# Patient Record
Sex: Male | Born: 1940 | Race: White | Hispanic: No | Marital: Married | Smoking: Never smoker
Health system: Southern US, Academic
[De-identification: ages and names within clinical notes are randomized; demographics above are authoritative.]

## PROBLEM LIST (undated history)

## (undated) DIAGNOSIS — I10 Essential (primary) hypertension: Secondary | ICD-10-CM

## (undated) DIAGNOSIS — N400 Enlarged prostate without lower urinary tract symptoms: Secondary | ICD-10-CM

## (undated) DIAGNOSIS — I82409 Acute embolism and thrombosis of unspecified deep veins of unspecified lower extremity: Secondary | ICD-10-CM

## (undated) DIAGNOSIS — K219 Gastro-esophageal reflux disease without esophagitis: Secondary | ICD-10-CM

## (undated) DIAGNOSIS — G459 Transient cerebral ischemic attack, unspecified: Secondary | ICD-10-CM

## (undated) DIAGNOSIS — E785 Hyperlipidemia, unspecified: Secondary | ICD-10-CM

## (undated) DIAGNOSIS — M1990 Unspecified osteoarthritis, unspecified site: Secondary | ICD-10-CM

## (undated) HISTORY — PX: HX KNEE REPLACMENT: SHX125

## (undated) HISTORY — PX: HX HERNIA REPAIR: SHX51

## (undated) HISTORY — PX: EYE SURGERY: SHX253

## (undated) HISTORY — PX: KIDNEY STONE SURGERY: SHX686

## (undated) HISTORY — PX: HX TONSILLECTOMY: SHX27

## (undated) HISTORY — PX: HX BACK SURGERY: SHX140

---

## 1985-04-16 ENCOUNTER — Other Ambulatory Visit (HOSPITAL_COMMUNITY): Payer: Self-pay

## 2020-11-02 IMAGING — MR MRI CERVICAL SPINE WITHOUT CONTRAST
4 of 5 series · 31 of 48 positions shown · IV contrast (gadolinium)
Comparison: None available.

﻿EXAM:  MRI CERVICAL SPINE WITHOUT CONTRAST
INDICATION: Bilateral lower extremity weakness and difficulty walking.
TECHNIQUE: Multiplanar multisequential MRI of the cervical spine was performed without gadolinium contrast.

[Series 5: T2 · sagittal · 3.0mm · 0.78mm/px · 7 of 11 slices shown (1 of 2)]
[im 1/11]
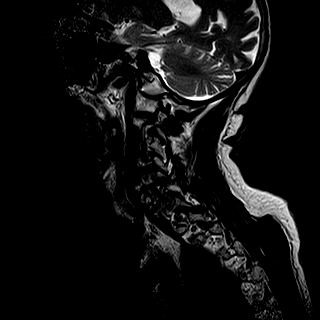
[im 2/11]
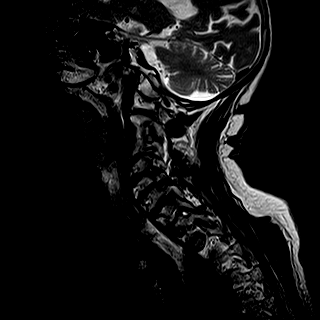
[im 4/11]
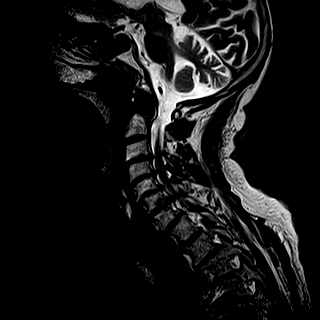
[im 6/11]
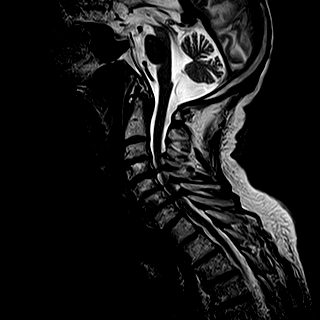
[im 7/11]
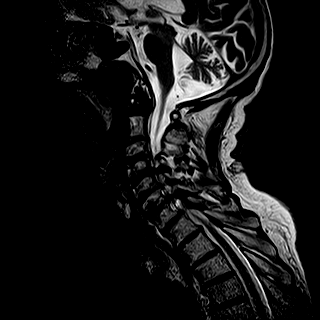
[im 9/11]
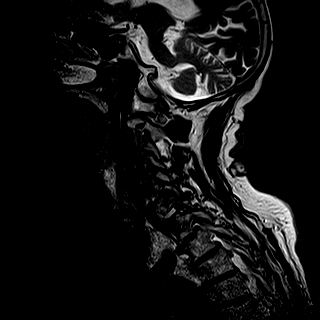
[im 11/11]
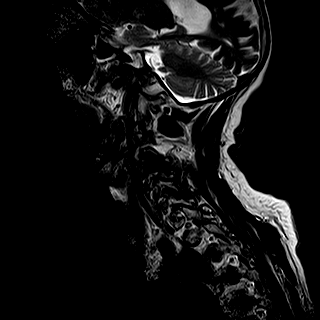

[Series 6: T1 · sagittal · 3.0mm · 0.78mm/px · 7 of 11 slices shown]
[im 1/11]
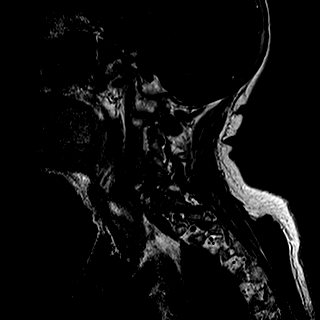
[im 2/11]
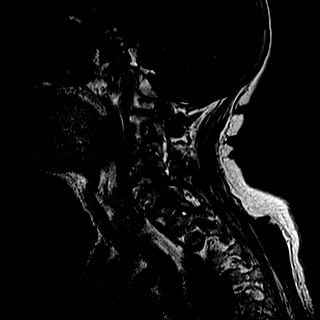
[im 4/11]
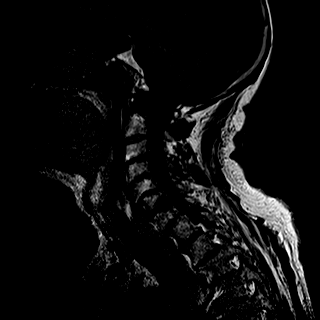
[im 6/11]
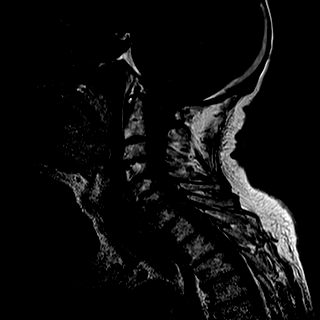
[im 7/11]
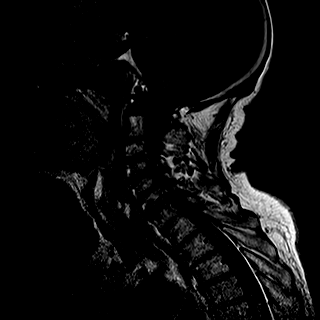
[im 9/11]
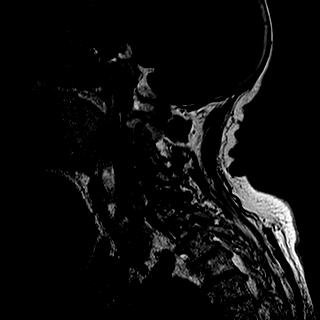
[im 11/11]
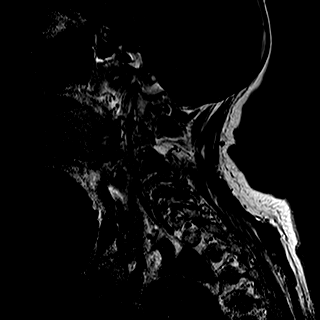

[Series 7: STIR · sagittal · 3.0mm · 0.87mm/px · 8 of 11 slices shown]
[im 1/11]
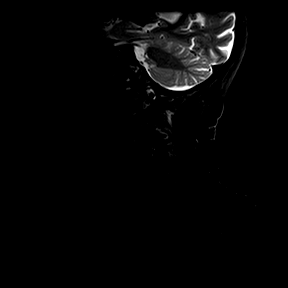
[im 2/11]
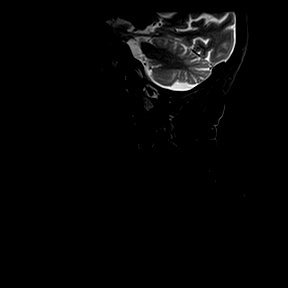
[im 3/11]
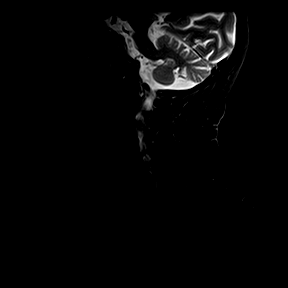
[im 5/11]
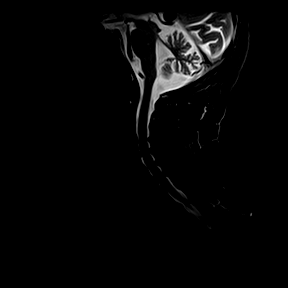
[im 6/11]
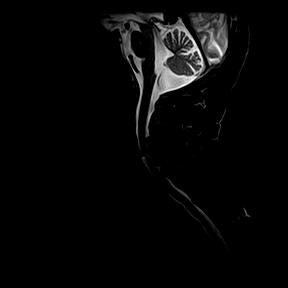
[im 8/11]
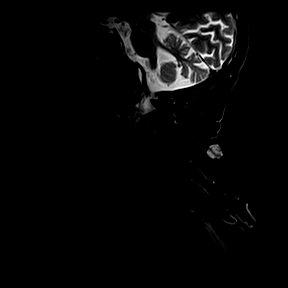
[im 9/11]
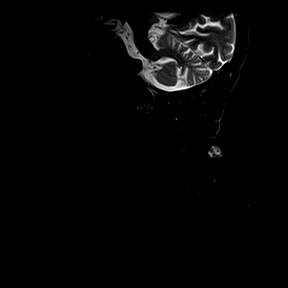
[im 11/11]
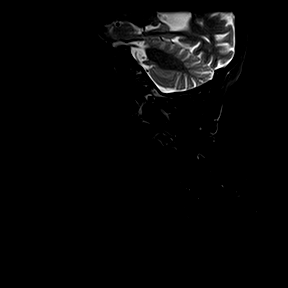

[Series 10: T2 · axial · 4.0mm · 0.31mm/px · z∈[-92,+8]mm · 9 of 18 slices shown (2 of 2)]
[im 1/18]
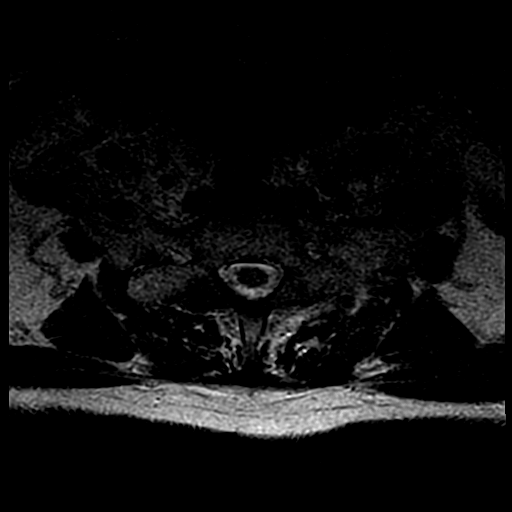
[im 3/18]
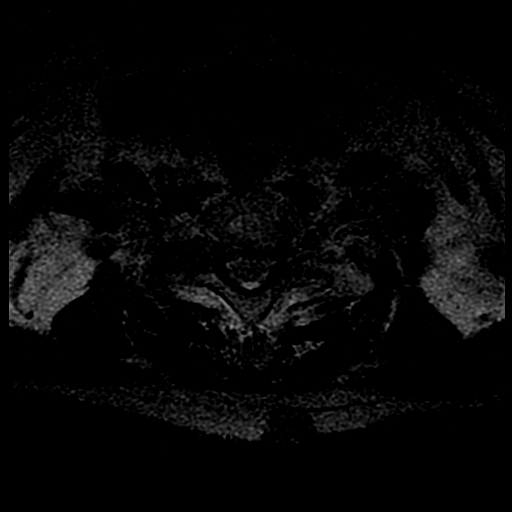
[im 6/18]
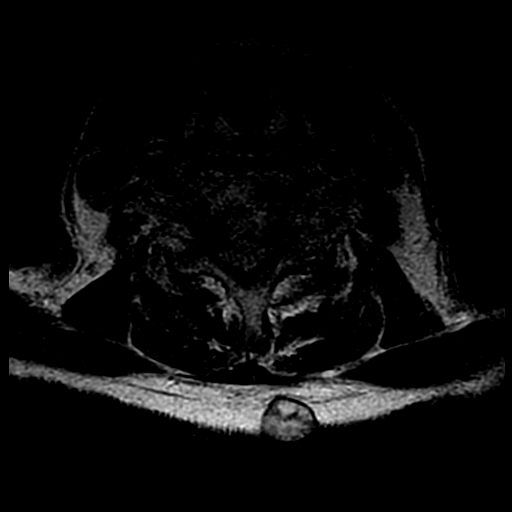
[im 8/18]
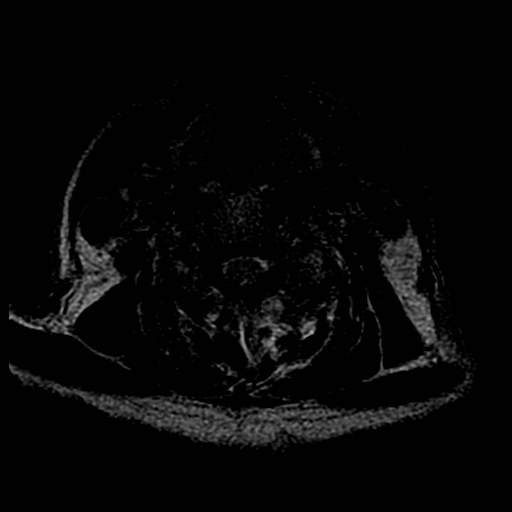
[im 9/18]
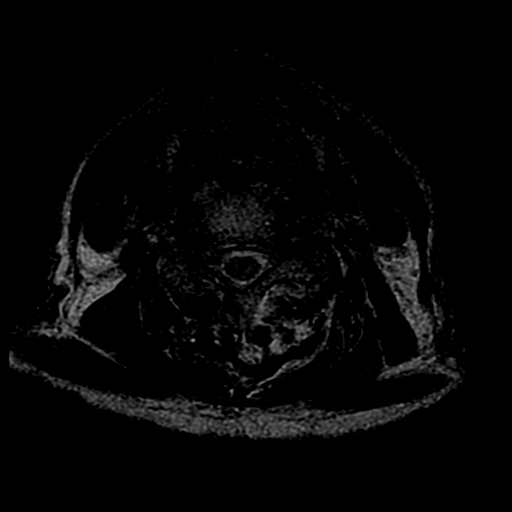
[im 10/18]
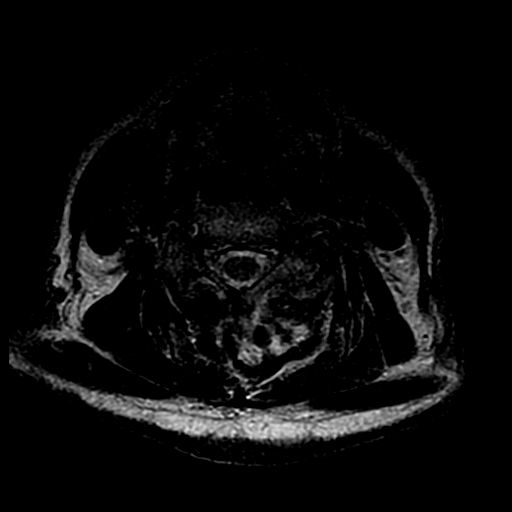
[im 12/18]
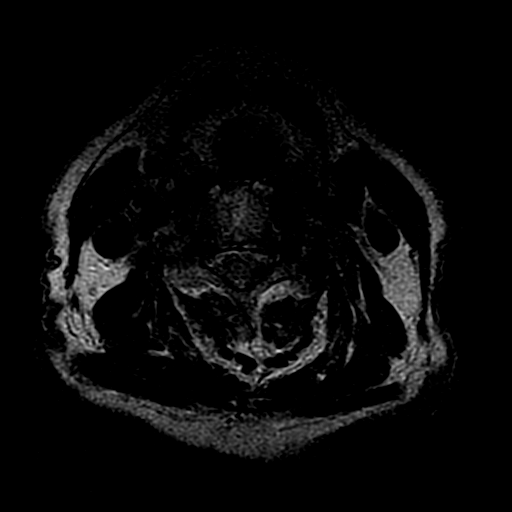
[im 15/18]
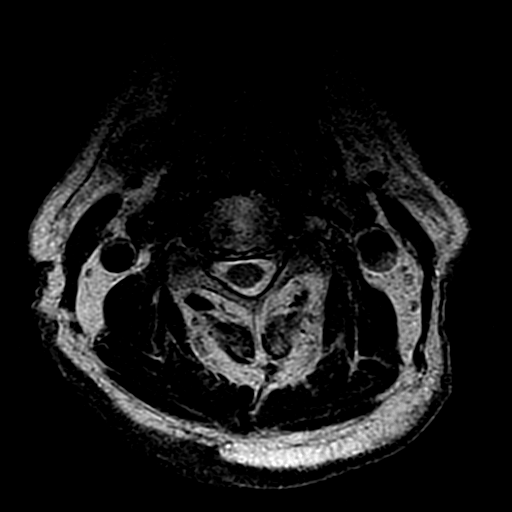
[im 18/18]
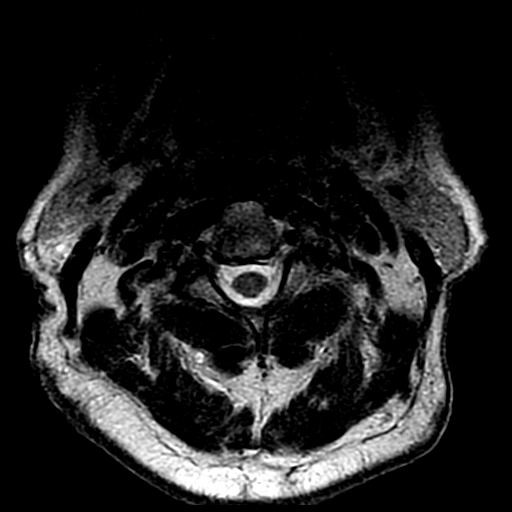

[31 of 48 positions shown; findings below may reference images not displayed]

FINDINGS: Vertebral bodies are normal in height, alignment and signal intensity. There is no acute fracture or subluxation. Moderate to marked disc desiccation is seen at most levels. Visualized spinal cord is normal in signal intensity.

At C2-3 level, there is mild right neural foraminal stenosis from facet and uncovertebral joint hypertrophy.

At C3-4 level, there is a small broad-based central disc osteophyte complex resulting in mild spinal stenosis. There is severe bilateral neural foraminal stenosis from facet and uncovertebral joint hypertrophy.

At C4-5 level, there is a small broad-based central disc osteophyte complex resulting in moderate spinal stenosis. There is severe bilateral neural foraminal stenosis from facet and uncovertebral joint hypertrophy.

At C5-6 level, there is a small broad-based central disc osteophyte complex resulting in mild spinal stenosis. There is severe bilateral neural foraminal stenosis from facet and uncovertebral joint hypertrophy.

At C6-7 level, there is a small broad-based central disc osteophyte complex resulting in moderate spinal stenosis. There is severe bilateral neural foraminal stenosis from facet and uncovertebral joint hypertrophy.

At C7-T1 level, there is a small broad-based central disc osteophyte complex with near complete effacement of the ventral CSF. There is mild left and mild-to-moderate right neural foraminal stenosis from facet and uncovertebral joint hypertrophy.

Paraspinal soft tissues are unremarkable. There is suggestion of a 15 mm probable sebaceous cyst within the subcutaneous tissues of the posterior neck.
IMPRESSION: 1. Advanced degenerative changes with moderate spinal stenosis at C4-5 and C6-7 levels from small central disc osteophyte complexes. No spinal cord signal abnormality is seen to suggest cord edema or myelomalacia. Close clinical follow-up and neurosurgical consult are recommended. 

2. Multilevel neural foraminal stenosis as detailed above.

## 2020-11-02 IMAGING — MR MRI LUMBAR SPINE WITHOUT CONTRAST
6 of 7 series · 40 of 48 positions shown · IV contrast (gadolinium)
Comparison: None available.

﻿EXAM:  MRI LUMBAR SPINE WITHOUT CONTRAST
INDICATION: Bilateral lower extremity weakness and difficulty walking.
TECHNIQUE: Multiplanar multisequential MRI of the lumbar spine was performed without gadolinium contrast.

[Series 5: T2 · sagittal · 4.0mm · 0.94mm/px · 4 of 13 slices shown (1 of 4)]
[im 1/13]
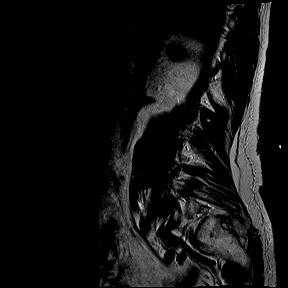
[im 5/13]
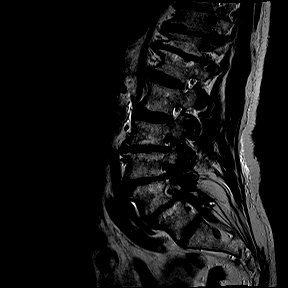
[im 9/13]
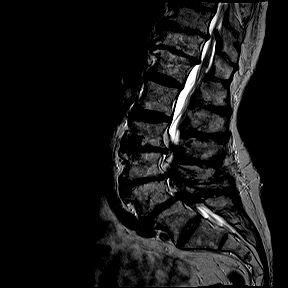
[im 13/13]
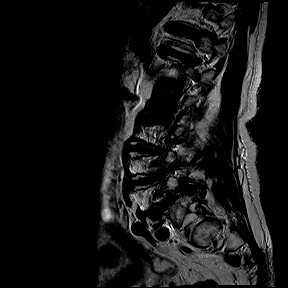

[Series 6: T1 · sagittal · 4.0mm · 0.94mm/px · 5 of 13 slices shown (1 of 2)]
[im 1/13]
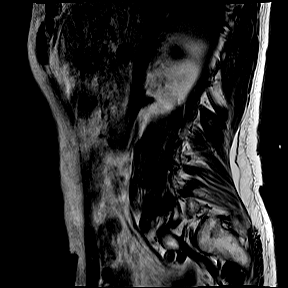
[im 4/13]
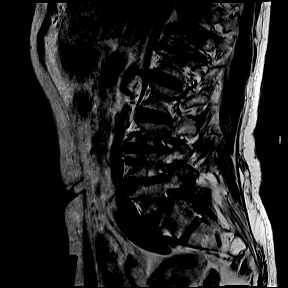
[im 7/13]
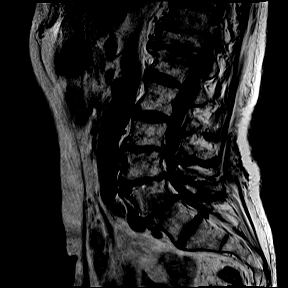
[im 10/13]
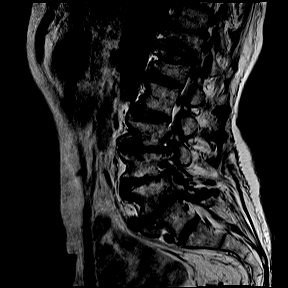
[im 13/13]
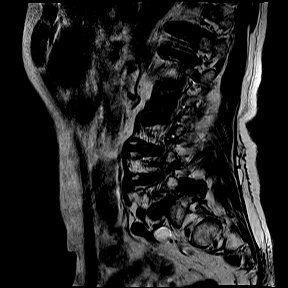

[Series 10: T2 · oblique · 4.0mm · 0.47mm/px · 9 of 23 slices shown (2 of 4)]
[im 1/23]
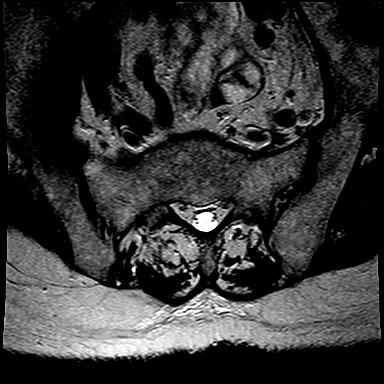
[im 3/23]
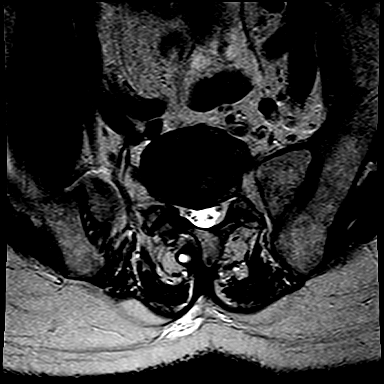
[im 6/23]
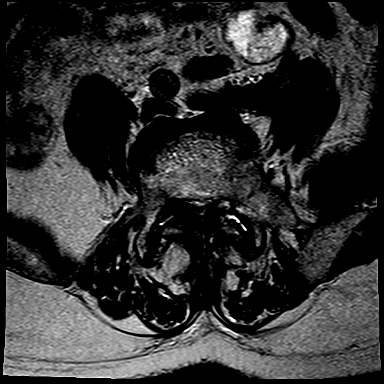
[im 9/23]
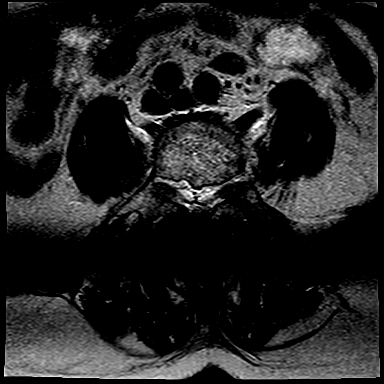
[im 12/23]
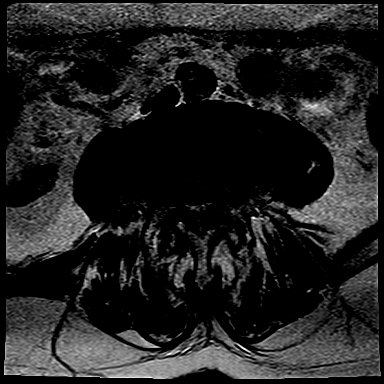
[im 14/23]
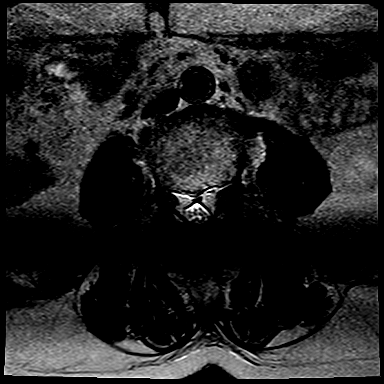
[im 17/23]
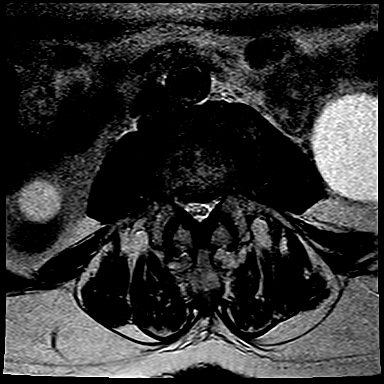
[im 20/23]
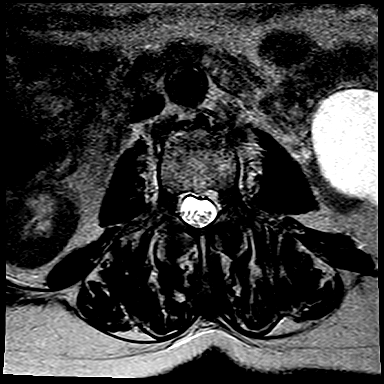
[im 23/23]
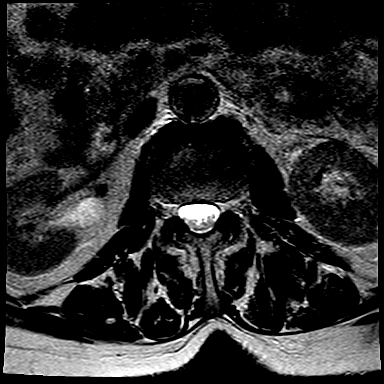

[Series 11: T1 · oblique · 4.0mm · 0.47mm/px · 6 of 23 slices shown (2 of 2)]
[im 1/23]
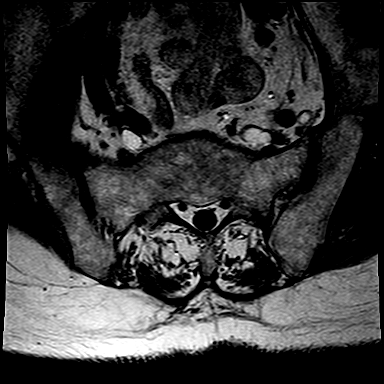
[im 3/23]
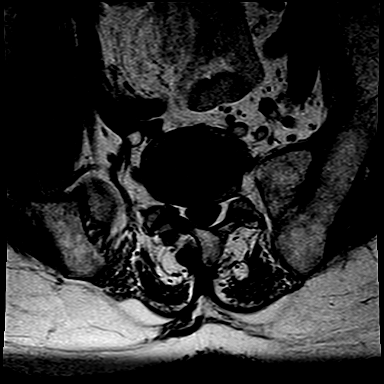
[im 6/23]
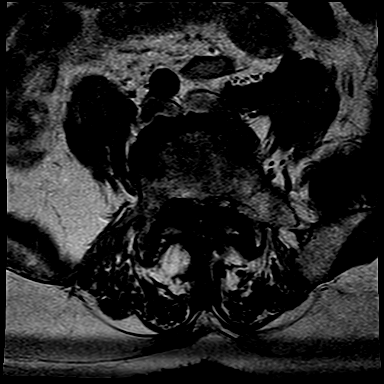
[im 9/23]
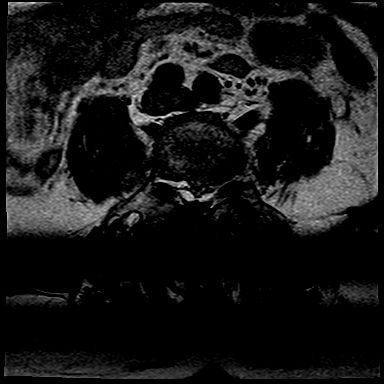
[im 14/23]
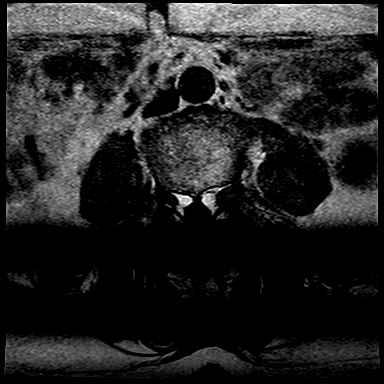
[im 17/23]
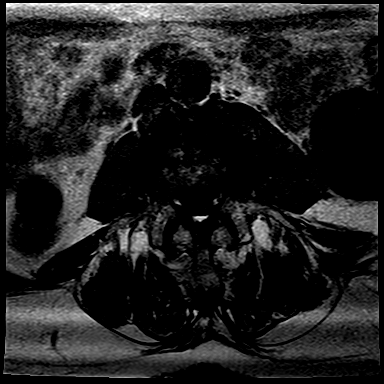

[Series 12: T2 · axial · 4.0mm · 0.52mm/px · z∈[+49,+141]mm · 8 of 20 slices shown (3 of 4)]
[im 1/20]
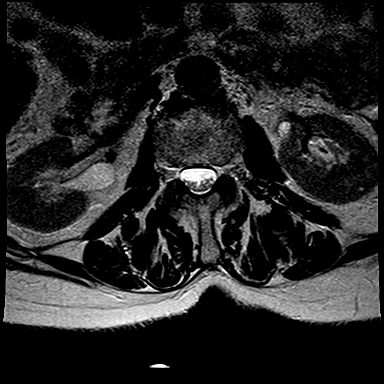
[im 3/20]
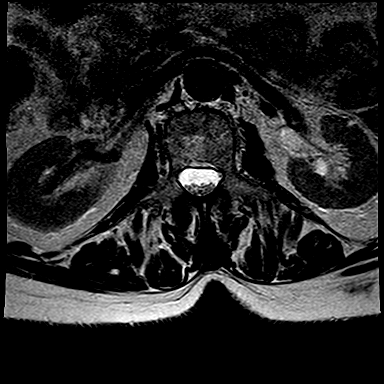
[im 6/20]
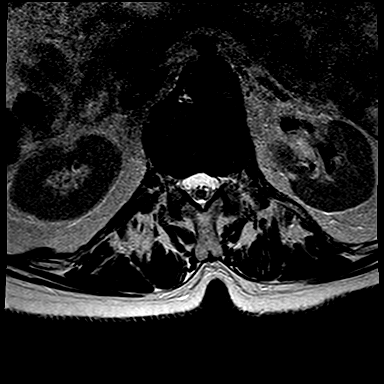
[im 9/20]
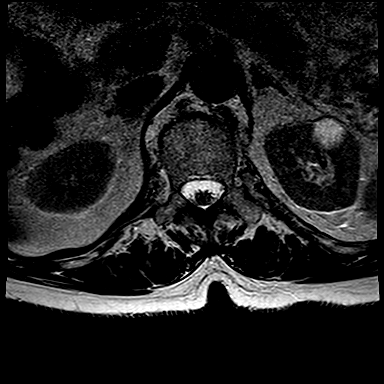
[im 11/20]
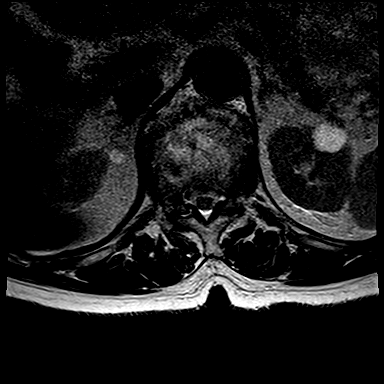
[im 14/20]
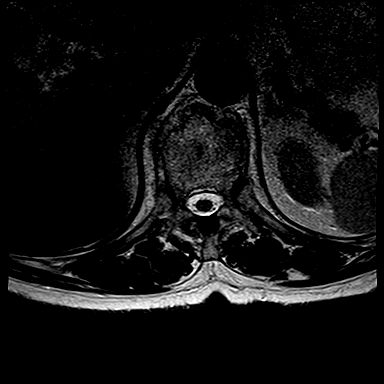
[im 17/20]
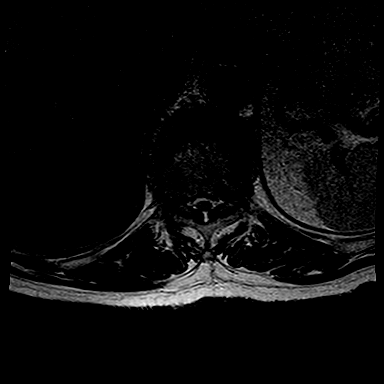
[im 20/20]
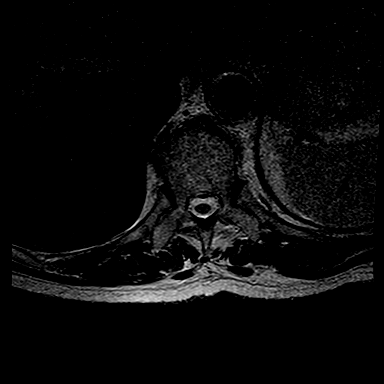

[Series 13: T2 · coronal · 4.0mm · 1.22mm/px · 8 of 20 slices shown (4 of 4)]
[im 1/20]
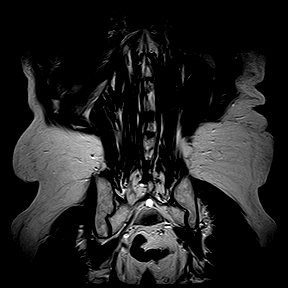
[im 3/20]
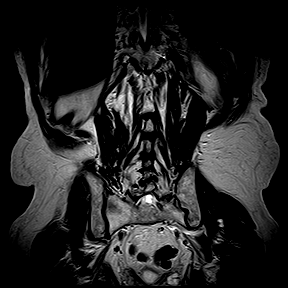
[im 6/20]
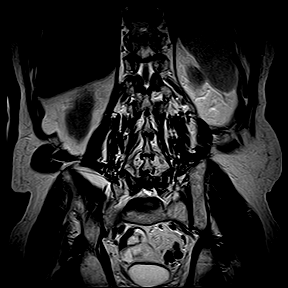
[im 9/20]
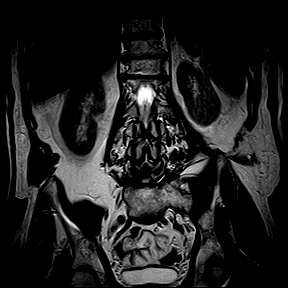
[im 11/20]
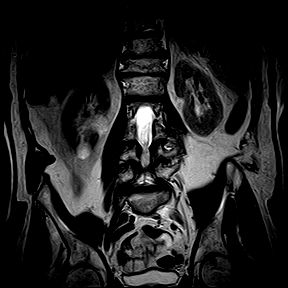
[im 14/20]
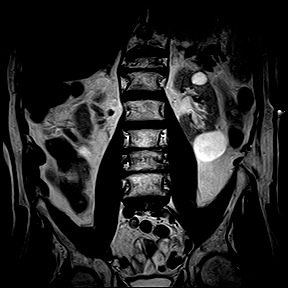
[im 17/20]
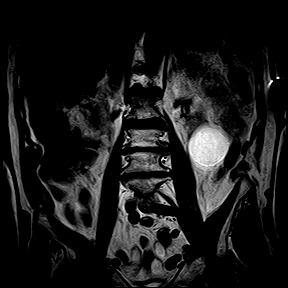
[im 20/20]
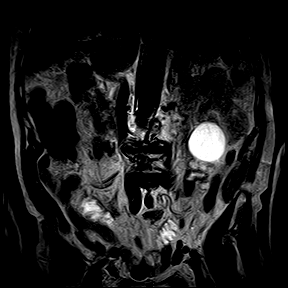

[40 of 48 positions shown; findings below may reference images not displayed]

FINDINGS: Bone marrow signal intensity is normal. There is no acute fracture or subluxation. Distal spinal cord is normal in signal intensity and terminates normally at T12-L1 disc space level. Spinal canal is congenitally narrow.

L1-2 level is unremarkable.

At L2-3 level, there is moderate to marked disc desiccation. There is a small broad-based central disc resulting in moderate to severe spinal stenosis. There is moderate to severe bilateral neural foraminal stenosis from facet arthropathy and bulging annulus.

At L3-4 level, there is moderate to marked disc desiccation. There is a small broad-based central disc bulge resulting in severe spinal stenosis. There is severe left and moderate right neural foraminal stenosis from facet arthropathy and bulging annulus.

At L4-5 level, there is marked disc desiccation. There is also grade 1 anterolisthesis of L4 on L5 vertebral body. There is a small broad-based central disc bulge resulting in severe spinal stenosis. There is severe bilateral neural foraminal stenosis from facet arthropathy and bulging annulus.

L5-S1 level and paraspinal soft tissues are unremarkable. There are bilateral renal cysts.
IMPRESSION: 1. Grade 1 anterolisthesis of L4 on L5 vertebral body. 

2. Severe spinal stenosis at L3-4 and L4-5 levels and moderate to severe spinal stenosis at L2-3 level from small central disc bulges. 

3. Multilevel neural foraminal stenosis as detailed above.

## 2021-08-22 IMAGING — US CAROTID US W/DOPPLER
1 series · 14 of 24 positions shown · non-contrast
Comparison: 08/25/2020.

﻿EXAM:  CAROTID US W/DOPPLER
INDICATION: Carotid stenosis. History of left carotid endarterectomy.

[Series 1: carotid us w/doppler · 14 of 87 slices shown]
[im 1/87]
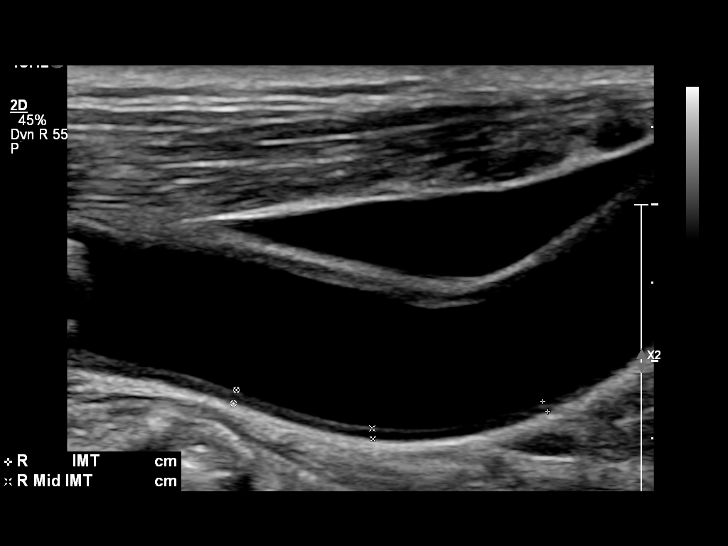
[im 8/87]
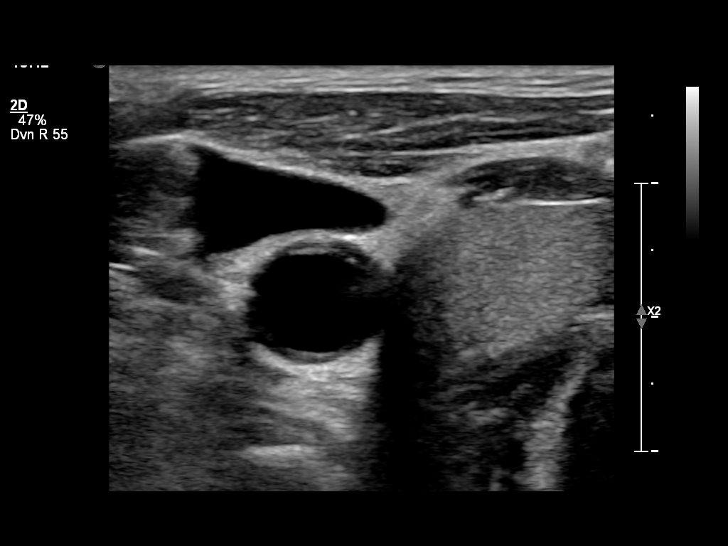
[im 15/87]
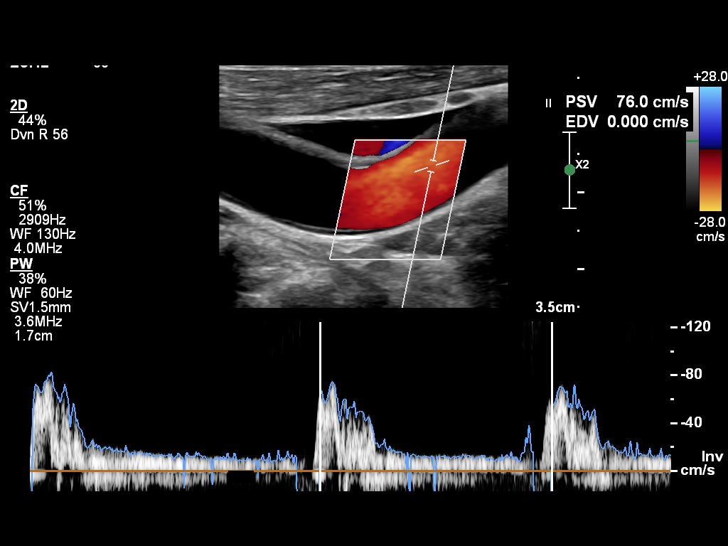
[im 23/87]
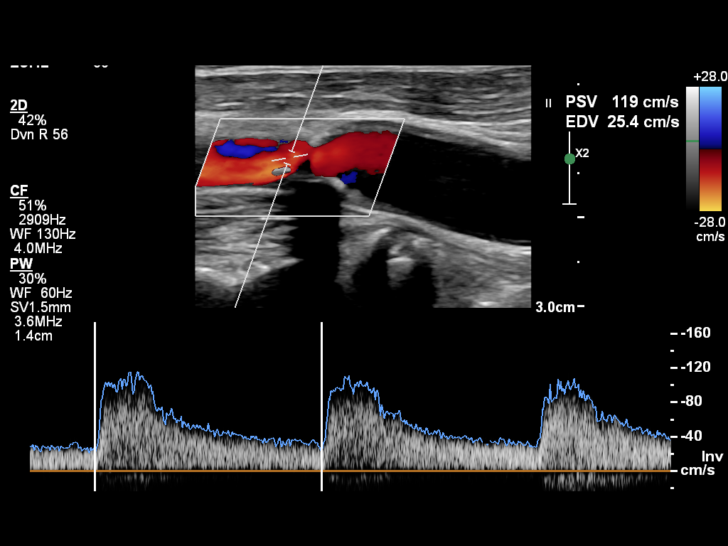
[im 27/87]
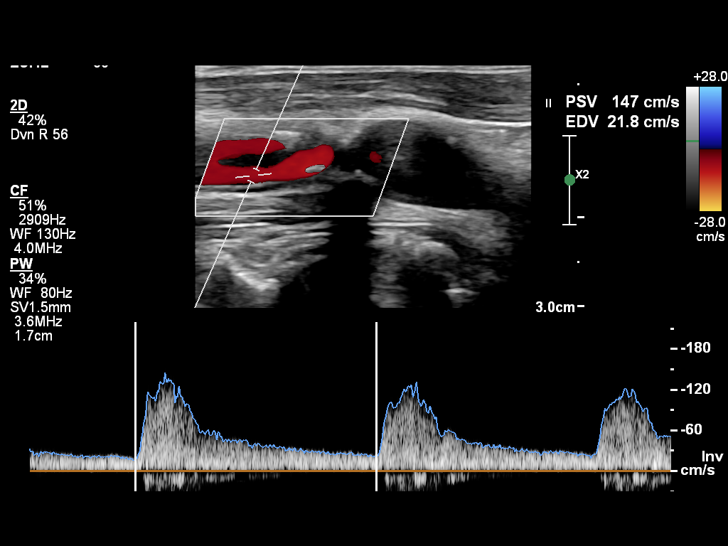
[im 34/87]
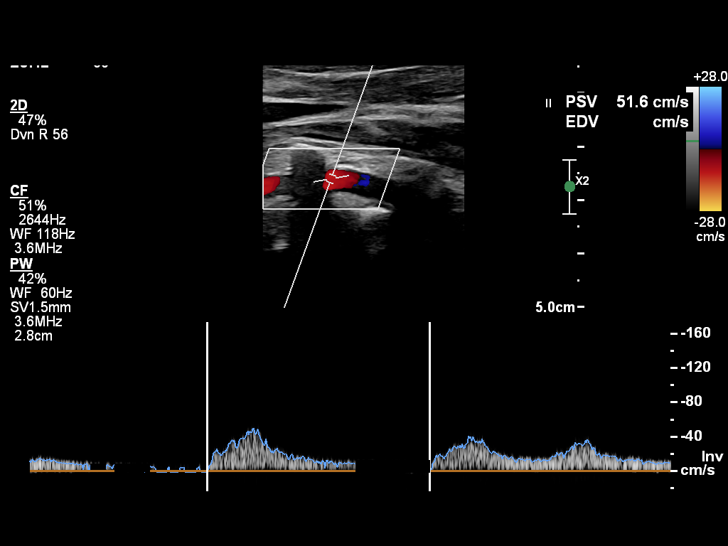
[im 42/87]
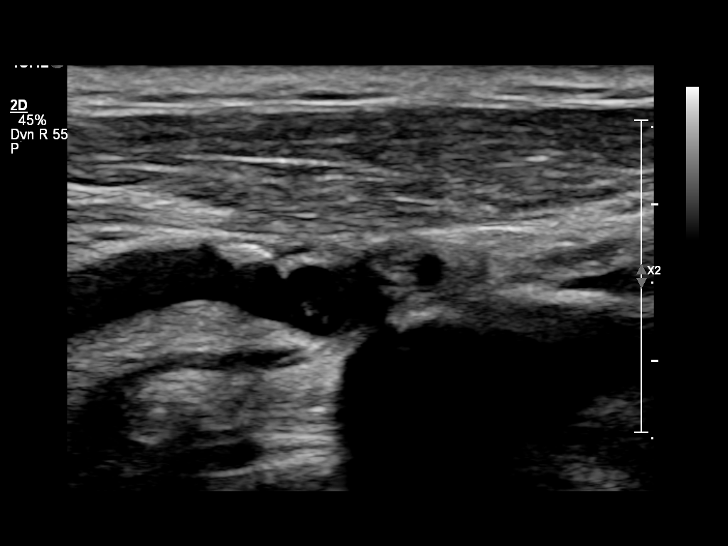
[im 45/87]
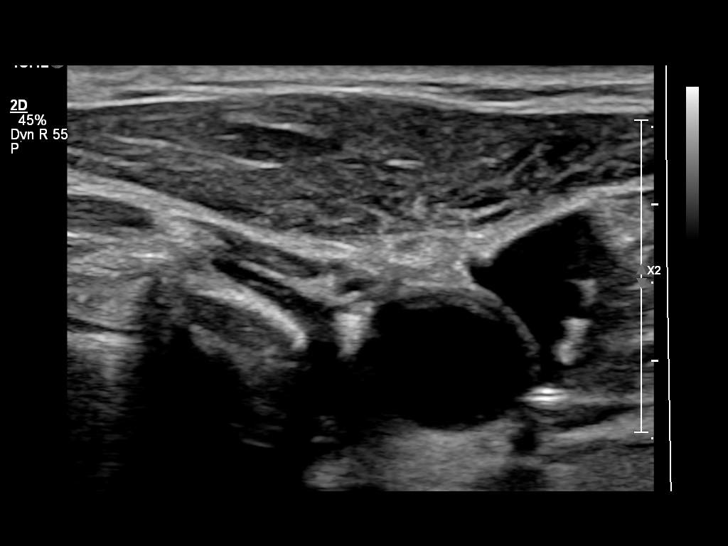
[im 53/87]
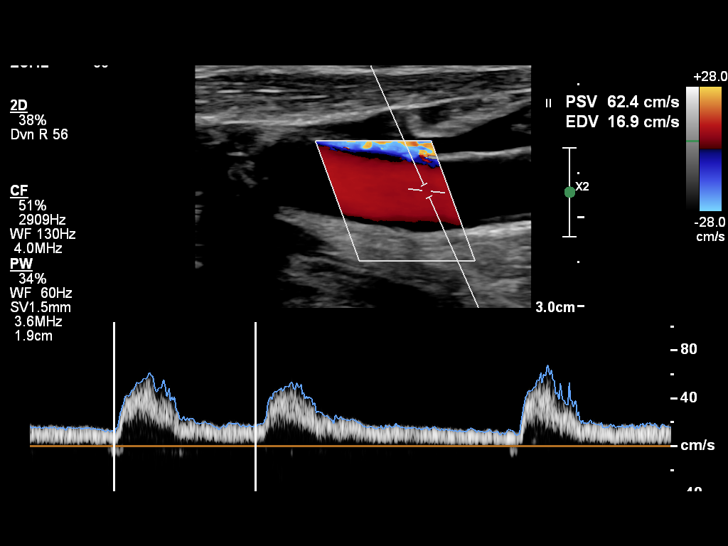
[im 60/87]
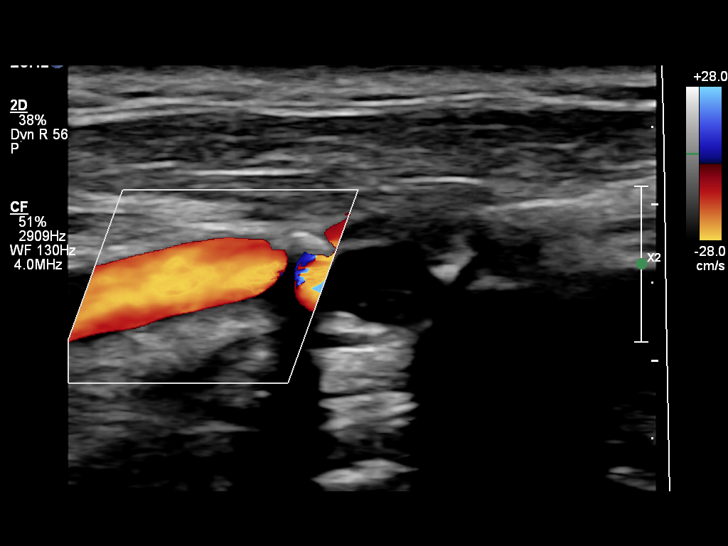
[im 68/87]
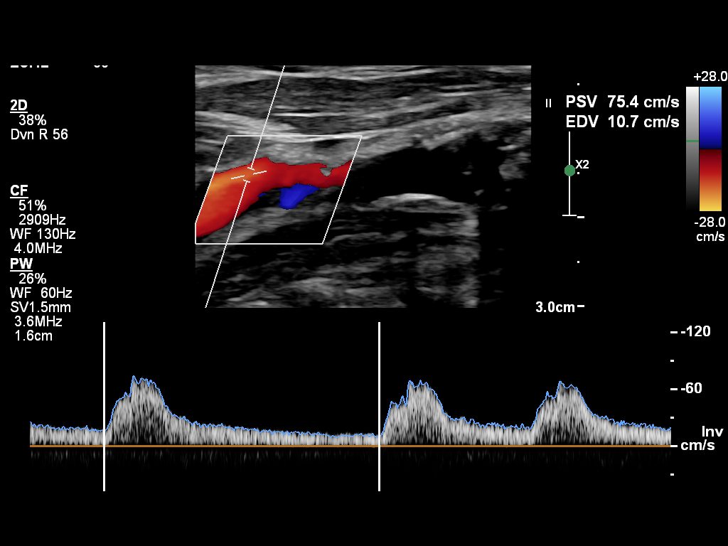
[im 72/87]
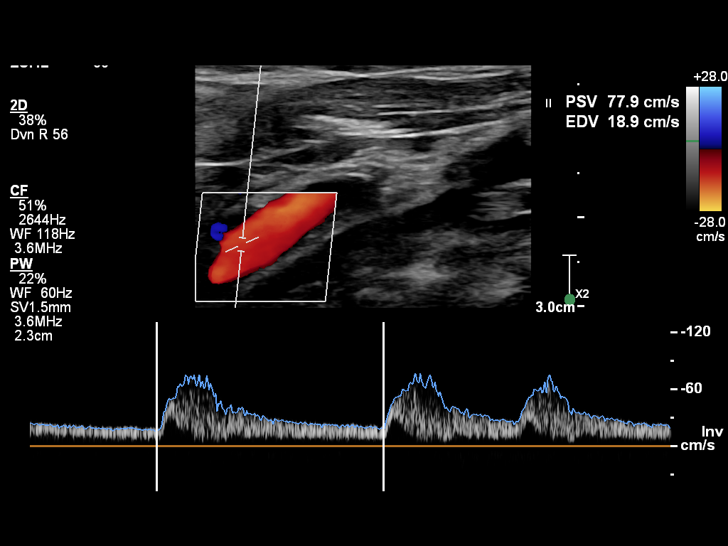
[im 79/87]
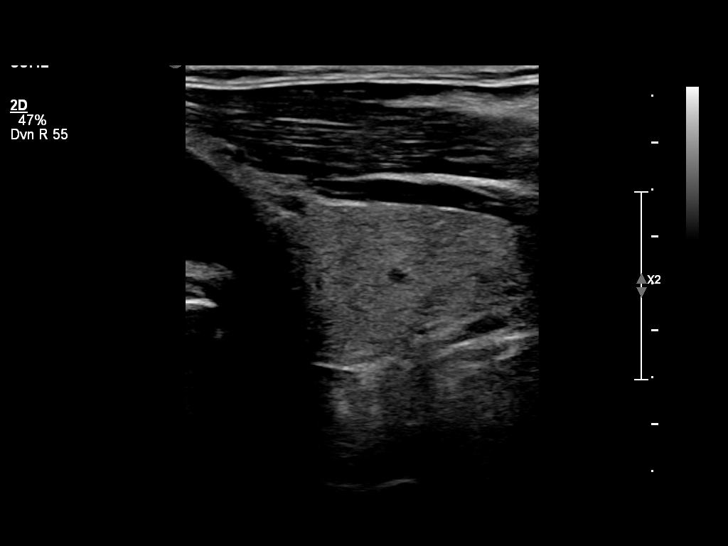
[im 87/87]
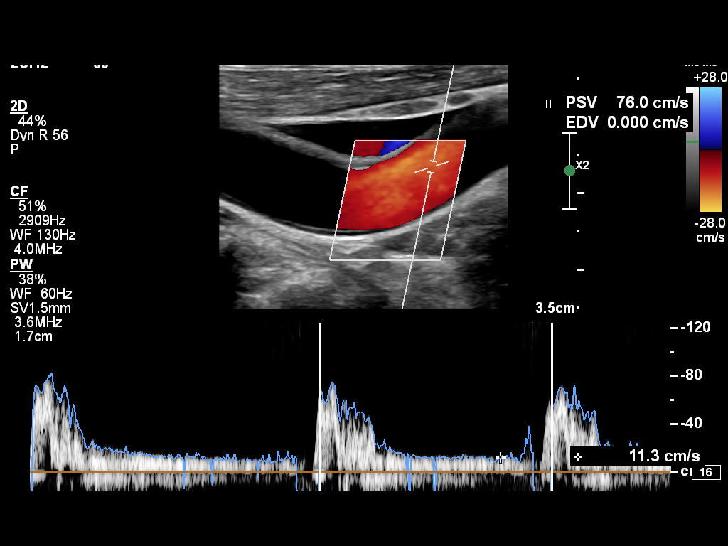

[14 of 24 positions shown; findings below may reference images not displayed]

FINDINGS: Right CCA

76

Left CCA

69

PSV

IC/CC

EDV

Right Internal

147

1.93

22

Left Internal

109

1.52

26

Right Vertebral

Antegrade

Left Vertebral

Antegrade

Moderate amount of calcified atherosclerotic plaque is seen within the carotid bulbs bilaterally. No hemodynamically significant stenosis or abnormal elevation of velocity is seen at any level. Both vertebral arteries are patent with appropriate antegrade direction of flow.
IMPRESSION: No hemodynamically significant stenosis within the internal carotid arteries as per NASCET criteria.

## 2022-03-25 ENCOUNTER — Other Ambulatory Visit (HOSPITAL_COMMUNITY): Payer: Self-pay

## 2022-03-25 DIAGNOSIS — M5416 Radiculopathy, lumbar region: Secondary | ICD-10-CM

## 2022-04-01 ENCOUNTER — Other Ambulatory Visit (HOSPITAL_COMMUNITY): Payer: Medicare Other

## 2022-04-03 ENCOUNTER — Inpatient Hospital Stay
Admission: RE | Admit: 2022-04-03 | Discharge: 2022-04-03 | Disposition: A | Discharge status: Home / Self Care | Payer: Medicare Other | Source: Ambulatory Visit

## 2022-04-03 ENCOUNTER — Other Ambulatory Visit: Payer: Self-pay

## 2022-04-03 ENCOUNTER — Encounter (HOSPITAL_COMMUNITY): Payer: Self-pay

## 2022-04-03 DIAGNOSIS — M5416 Radiculopathy, lumbar region: Secondary | ICD-10-CM | POA: Insufficient documentation

## 2022-04-03 HISTORY — DX: Unspecified osteoarthritis, unspecified site: M19.90

## 2022-04-03 HISTORY — DX: Hyperlipidemia, unspecified: E78.5

## 2022-04-03 HISTORY — DX: Benign prostatic hyperplasia without lower urinary tract symptoms: N40.0

## 2022-04-03 HISTORY — DX: Acute embolism and thrombosis of unspecified deep veins of unspecified lower extremity (CMS HCC): I82.409

## 2022-04-03 HISTORY — DX: Gastro-esophageal reflux disease without esophagitis: K21.9

## 2022-04-03 HISTORY — DX: Essential (primary) hypertension: I10

## 2022-04-03 HISTORY — DX: Transient cerebral ischemic attack, unspecified: G45.9

## 2022-04-03 MED ORDER — IOHEXOL 350 MG IODINE/ML INTRAVENOUS SOLUTION
5.0000 mL | INTRAVENOUS | Status: AC
Start: 2022-04-03 — End: 2022-04-03
  Administered 2022-04-03: 5 mL via INTRA_ARTICULAR

## 2022-04-03 MED ORDER — DEXAMETHASONE SODIUM PHOSPHATE (PF) 10 MG/ML INJECTION SOLUTION
INTRAMUSCULAR | Status: AC
Start: 2022-04-03 — End: 2022-04-03
  Filled 2022-04-03: qty 1

## 2022-04-03 MED ORDER — LIDOCAINE (PF) 20 MG/ML (2 %) INJECTION SOLUTION
INTRAMUSCULAR | Status: AC
Start: 2022-04-03 — End: 2022-04-03
  Filled 2022-04-03: qty 15

## 2022-04-03 MED ORDER — DEXAMETHASONE SODIUM PHOSPHATE (PF) 10 MG/ML INJECTION SOLUTION
Freq: Once | INTRAMUSCULAR | Status: AC | PRN
Start: 2022-04-03 — End: 2022-04-03
  Administered 2022-04-03 (×2): 5 mg via EPIDURAL

## 2022-04-03 MED ORDER — LIDOCAINE HCL 20 MG/ML (2 %) INJECTION SOLUTION
Freq: Once | INTRAMUSCULAR | Status: AC | PRN
Start: 2022-04-03 — End: 2022-04-03
  Administered 2022-04-03 (×2): 1 mL via INTRAMUSCULAR

## 2022-04-03 MED ORDER — LIDOCAINE HCL 20 MG/ML (2 %) INJECTION SOLUTION
Freq: Once | INTRAMUSCULAR | Status: AC | PRN
Start: 2022-04-03 — End: 2022-04-03
  Administered 2022-04-03: 5 mL via INTRADERMAL

## 2022-04-03 NOTE — Brief Op Note (Signed)
04/03/22      Radiologist: Dr. Neomia Dear    Procedure: PAIN CL FL EPIDURAL STEROID INJECTION LUMBAR    Description of Procedure Findings:      Lumbar radiculopathy        Estimated Blood Loss:  0 mL        Diagnosis: Lumbar radiculopathy; as above; dictated report to follow      Neomia Dear, MD

## 2022-04-03 NOTE — Discharge Instructions (Signed)
SURGICAL DISCHARGE INSTRUCTIONS     Dr. Dortha Schwalbe*  performed your * Transforaminal Epidural Steroid Injection bilateral L4/5* today at the [facility]    [facility]:  Monday through Friday from 8 a.m. - 4 p.m.: [phone]    For T&D: [id number]  Between 4 p.m. - 8 a.m., weekends and holidays:  Call ER [phone]    PLEASE SEE WRITTEN HANDOUTS AS DISCUSSED BY YOUR NURSE:  ESI    SIGNS AND SYMPTOMS OF A WOUND / INCISION INFECTION   Be sure to watch for the following:  Increase in redness or red streaks near or around the wound or incision.  Increase in pain that is intense or severe and cannot be relieved by the pain medication that your doctor has given you.  Increase in fever for longer than 24 hours, or an increase that is higher than 101 degrees Fahrenheit (normal body temperature is 98 degrees Fahrenheit). **CALL YOUR DOCTOR IF ONE OR MORE OF THESE SIGNS / SYMPTOMS SHOULD OCCUR.    ANESTHESIA INFORMATION   LOCAL ANESTHETIC:  You have receieved a local anesthetic, the effects should disappear in a few hours.    REMEMBER   If you experience any difficulty breathing, chest pain, bleeding that you feel is excessive, persistent nausea or vomiting or for any other concerns:  Call your physician Dr.  Dortha Schwalbe   at [phone] . You may also ask to have the general doctor on call paged. They are available to you 24 hours a day.      SPECIAL INSTRUCTIONS / COMMENTS   Ice to injection site as needed for pain or swelling for next 24 hours. Then you may alternate heat and ice if needed. Tylenol as needed for pain.  Please notify Dr. Tempie Donning that you have developed the numbness in the legs that goes into your rectum that wasn't there at your last visit.    FOLLOW-UP APPOINTMENTS   Please call your surgeon's office at the number listed to schedule a date / time of return for follow-up.   Keep your appointment with Dr. Tempie Donning .  Dr. Dortha Schwalbe will call you tomorrow to check on you.    No driving or climbing ladders today.
